# Patient Record
Sex: Male | Born: 1985 | Race: White | Hispanic: No | Marital: Married | Smoking: Never smoker
Health system: Southern US, Community
[De-identification: ages and names within clinical notes are randomized; demographics above are authoritative.]

## PROBLEM LIST (undated history)

## (undated) DIAGNOSIS — K219 Gastro-esophageal reflux disease without esophagitis: Secondary | ICD-10-CM

---

## 2015-07-15 ENCOUNTER — Encounter (HOSPITAL_COMMUNITY): Payer: Self-pay | Admitting: Emergency Medicine

## 2015-07-15 ENCOUNTER — Emergency Department (HOSPITAL_COMMUNITY): Payer: BLUE CROSS/BLUE SHIELD

## 2015-07-15 ENCOUNTER — Emergency Department (HOSPITAL_COMMUNITY)
Admission: EM | Admit: 2015-07-15 | Discharge: 2015-07-15 | Disposition: A | Discharge status: Home / Self Care | Payer: BLUE CROSS/BLUE SHIELD | Attending: Emergency Medicine | Admitting: Emergency Medicine

## 2015-07-15 DIAGNOSIS — K219 Gastro-esophageal reflux disease without esophagitis: Secondary | ICD-10-CM | POA: Diagnosis not present

## 2015-07-15 DIAGNOSIS — R079 Chest pain, unspecified: Secondary | ICD-10-CM | POA: Diagnosis not present

## 2015-07-15 DIAGNOSIS — R1012 Left upper quadrant pain: Secondary | ICD-10-CM | POA: Diagnosis present

## 2015-07-15 DIAGNOSIS — K5909 Other constipation: Secondary | ICD-10-CM

## 2015-07-15 DIAGNOSIS — Z79899 Other long term (current) drug therapy: Secondary | ICD-10-CM | POA: Insufficient documentation

## 2015-07-15 HISTORY — DX: Gastro-esophageal reflux disease without esophagitis: K21.9

## 2015-07-15 LAB — COMPREHENSIVE METABOLIC PANEL
ALT: 37 U/L (ref 17–63)
ANION GAP: 8 (ref 5–15)
AST: 31 U/L (ref 15–41)
Albumin: 4.5 g/dL (ref 3.5–5.0)
Alkaline Phosphatase: 52 U/L (ref 38–126)
BILIRUBIN TOTAL: 0.4 mg/dL (ref 0.3–1.2)
BUN: 15 mg/dL (ref 6–20)
CO2: 22 mmol/L (ref 22–32)
Calcium: 9.5 mg/dL (ref 8.9–10.3)
Chloride: 107 mmol/L (ref 101–111)
Creatinine, Ser: 1.01 mg/dL (ref 0.61–1.24)
GFR calc Af Amer: >60 mL/min (ref >60)
Glucose, Bld: 105 mg/dL — ABNORMAL HIGH (ref 65–99)
POTASSIUM: 3.8 mmol/L (ref 3.5–5.1)
Sodium: 137 mmol/L (ref 135–145)
TOTAL PROTEIN: 7.5 g/dL (ref 6.5–8.1)

## 2015-07-15 LAB — CBC WITH DIFFERENTIAL/PLATELET
Basophils Absolute: 0 10*3/uL (ref 0.0–0.1)
Basophils Relative: 0 %
Eosinophils Absolute: 0 10*3/uL (ref 0.0–0.7)
Eosinophils Relative: 0 %
HEMATOCRIT: 44.2 % (ref 39.0–52.0)
Hemoglobin: 15.4 g/dL (ref 13.0–17.0)
LYMPHS ABS: 1.6 10*3/uL (ref 0.7–4.0)
LYMPHS PCT: 22 %
MCH: 28.8 pg (ref 26.0–34.0)
MCHC: 34.8 g/dL (ref 30.0–36.0)
MCV: 82.8 fL (ref 78.0–100.0)
MONO ABS: 0.6 10*3/uL (ref 0.1–1.0)
MONOS PCT: 8 %
NEUTROS ABS: 4.9 10*3/uL (ref 1.7–7.7)
Neutrophils Relative %: 70 %
Platelets: 212 10*3/uL (ref 150–400)
RBC: 5.34 MIL/uL (ref 4.22–5.81)
RDW: 13.1 % (ref 11.5–15.5)
WBC: 7.2 10*3/uL (ref 4.0–10.5)

## 2015-07-15 LAB — URINALYSIS, ROUTINE W REFLEX MICROSCOPIC
BILIRUBIN URINE: NEGATIVE
Glucose, UA: NEGATIVE mg/dL
HGB URINE DIPSTICK: NEGATIVE
KETONES UR: NEGATIVE mg/dL
Leukocytes, UA: NEGATIVE
NITRITE: NEGATIVE
PH: 7.5 (ref 5.0–8.0)
Protein, ur: NEGATIVE mg/dL
Specific Gravity, Urine: 1.015 (ref 1.005–1.030)
Urobilinogen, UA: 0.2 mg/dL (ref 0.0–1.0)

## 2015-07-15 LAB — LIPASE, BLOOD: LIPASE: 41 U/L (ref 22–51)

## 2015-07-15 MED ORDER — LORAZEPAM 2 MG/ML IJ SOLN
1.0000 mg | Freq: Once | INTRAMUSCULAR | Status: AC
  Administered 2015-07-15: 1 mg via INTRAVENOUS
  Filled 2015-07-15: qty 1

## 2015-07-15 MED ORDER — SUCRALFATE 1 G PO TABS
1.0000 g | ORAL_TABLET | Freq: Once | ORAL | Status: AC
  Administered 2015-07-15: 1 g via ORAL
  Filled 2015-07-15: qty 1

## 2015-07-15 MED ORDER — SODIUM CHLORIDE 0.9 % IV SOLN
INTRAVENOUS | Status: DC
  Administered 2015-07-15: 08:00:00 via INTRAVENOUS

## 2015-07-15 MED ORDER — GI COCKTAIL ~~LOC~~
30.0000 mL | Freq: Once | ORAL | Status: AC
  Administered 2015-07-15: 30 mL via ORAL
  Filled 2015-07-15: qty 30

## 2015-07-15 MED ORDER — BISACODYL 5 MG PO TBEC: 5.0000 mg | DELAYED_RELEASE_TABLET | Freq: Two times a day (BID) | ORAL | Status: DC

## 2015-07-15 NOTE — ED Provider Notes (Signed)
CSN: [id number]     Arrival date & time 07/15/15  0654 History   First MD Initiated Contact with Patient 07/15/15 [phone]     Chief Complaint  Patient presents with  . Abdominal Pain  . Chest Pain     (Consider location/radiation/quality/duration/timing/severity/associated sxs/prior Treatment) HPI Comments: This is pts 3rd episode since April , not seen by pcp  Patient is a 29 y.o. male presenting with abdominal pain and chest pain. The history is provided by the patient.  Abdominal Pain Pain location:  LUQ, LLQ and epigastric Pain quality: aching and bloating   Pain radiates to:  L shoulder and R shoulder Pain severity:  No pain Onset quality:  Sudden Duration:  6 hours Timing:  Constant Progression:  Worsening Chronicity:  Recurrent Context: not alcohol use   Relieved by:  Nothing Worsened by:  Nothing tried Ineffective treatments:  OTC medications Associated symptoms: chest pain   Chest Pain Associated symptoms: abdominal pain     Past Medical History  Diagnosis Date  . Acid reflux    History reviewed. No pertinent past surgical history. History reviewed. No pertinent family history. Social History  Substance Use Topics  . Smoking status: Never Smoker   . Smokeless tobacco: None  . Alcohol Use: No    Review of Systems  Cardiovascular: Positive for chest pain.  Gastrointestinal: Positive for abdominal pain.  All other systems reviewed and are negative.     Allergies  Sulfa antibiotics  Home Medications   Prior to Admission medications   Medication Sig Start Date End Date Taking? Authorizing Provider  aspirin-acetaminophen-caffeine (EXCEDRIN MIGRAINE) [phone] MG per tablet Take 1 tablet by mouth every 6 (six) hours as needed for headache.   Yes Historical Provider, MD  bismuth subsalicylate (PEPTO BISMOL) 262 MG chewable tablet Chew 524 mg by mouth as needed for indigestion.   Yes Historical Provider, MD  calcium carbonate (TUMS - DOSED IN MG ELEMENTAL  CALCIUM) 500 MG chewable tablet Chew 1 tablet by mouth daily.   Yes Historical Provider, MD  diphenhydrAMINE (BENADRYL) 25 mg capsule Take 25 mg by mouth every 6 (six) hours as needed for allergies or sleep.   Yes Historical Provider, MD  DM-Doxylamine-Acetaminophen (NYQUIL COLD & FLU PO) Take 1 tablet by mouth every 8 (eight) hours as needed (cold/flu).   Yes Historical Provider, MD  lansoprazole (PREVACID) 15 MG capsule Take 15 mg by mouth daily at 12 noon.   Yes Historical Provider, MD  Simethicone (GAS RELIEF PO) Take 1 tablet by mouth every 6 (six) hours as needed (for gas).   Yes Historical Provider, MD   BP 146/86 mmHg  Pulse 66  Temp(Src) 98.6 F (37 C) (Oral)  Resp 20  SpO2 100% Physical Exam  Constitutional: He is oriented to person, place, and time. He appears well-developed and well-nourished.  Non-toxic appearance. No distress.  HENT:  Head: Normocephalic and atraumatic.  Eyes: Conjunctivae, EOM and lids are normal. Pupils are equal, round, and reactive to light.  Neck: Normal range of motion. Neck supple. No tracheal deviation present. No thyroid mass present.  Cardiovascular: Normal rate, regular rhythm and normal heart sounds.  Exam reveals no gallop.   No murmur heard. Pulmonary/Chest: Effort normal and breath sounds normal. No stridor. No respiratory distress. He has no decreased breath sounds. He has no wheezes. He has no rhonchi. He has no rales.  Abdominal: Soft. Normal appearance and bowel sounds are normal. He exhibits no distension. There is no tenderness. There is no  rigidity, no rebound, no guarding and no CVA tenderness.    Musculoskeletal: Normal range of motion. He exhibits no edema or tenderness.  Neurological: He is alert and oriented to person, place, and time. He has normal strength. No cranial nerve deficit or sensory deficit. GCS eye subscore is 4. GCS verbal subscore is 5. GCS motor subscore is 6.  Skin: Skin is warm and dry. No abrasion and no rash  noted.  Psychiatric: He has a normal mood and affect. His speech is normal and behavior is normal.  Nursing note and vitals reviewed.   ED Course  Procedures (including critical care time) Labs Review Labs Reviewed  CBC WITH DIFFERENTIAL/PLATELET  COMPREHENSIVE METABOLIC PANEL  LIPASE, BLOOD  URINALYSIS, ROUTINE W REFLEX MICROSCOPIC (NOT AT [facility])    Imaging Review No results found. I have personally reviewed and evaluated these images and lab results as part of my medical decision-making.   EKG Interpretation None      MDM   Final diagnoses:  None    Patient to be treated for constipation    Lorre Nick, MD 07/15/15 [phone]

## 2015-07-15 NOTE — Discharge Instructions (Signed)
Abdominal Pain °Many things can cause abdominal pain. Usually, abdominal pain is not caused by a disease and will improve without treatment. It can often be observed and treated at home. Your health care provider will do a physical exam and possibly order blood tests and X-rays to help determine the seriousness of your pain. However, in many cases, more time must pass before a clear cause of the pain can be found. Before that point, your health care provider may not know if you need more testing or further treatment. °HOME CARE INSTRUCTIONS  °Monitor your abdominal pain for any changes. The following actions may help to alleviate any discomfort you are experiencing: °· Only take over-the-counter or prescription medicines as directed by your health care provider. °· Do not take laxatives unless directed to do so by your health care provider. °· Try a clear liquid diet (broth, tea, or water) as directed by your health care provider. Slowly move to a bland diet as tolerated. °SEEK MEDICAL CARE IF: °· You have unexplained abdominal pain. °· You have abdominal pain associated with nausea or diarrhea. °· You have pain when you urinate or have a bowel movement. °· You experience abdominal pain that wakes you in the night. °· You have abdominal pain that is worsened or improved by eating food. °· You have abdominal pain that is worsened with eating fatty foods. °· You have a fever. °SEEK IMMEDIATE MEDICAL CARE IF:  °· Your pain does not go away within 2 hours. °· You keep throwing up (vomiting). °· Your pain is felt only in portions of the abdomen, such as the right side or the left lower portion of the abdomen. °· You pass bloody or black tarry stools. °MAKE SURE YOU: °· Understand these instructions.   °· Will watch your condition.   °· Will get help right away if you are not doing well or get worse.   °Document Released: 07/16/2005 Document Revised: 10/11/2013 Document Reviewed: 06/15/2013 °[employer]® Patient Information  ©2015 [employer]. This information is not intended to replace advice given to you by your health care provider. Make sure you discuss any questions you have with your health care provider. ° °Constipation °Constipation is when a person has fewer than three bowel movements a week, has difficulty having a bowel movement, or has stools that are dry, hard, or larger than normal. As people grow older, constipation is more common. If you try to fix constipation with medicines that make you have a bowel movement (laxatives), the problem may get worse. Long-term laxative use may cause the muscles of the colon to become weak. A low-fiber diet, not taking in enough fluids, and taking certain medicines may make constipation worse.  °CAUSES  °· Certain medicines, such as antidepressants, pain medicine, iron supplements, antacids, and water pills.   °· Certain diseases, such as diabetes, irritable bowel syndrome (IBS), thyroid disease, or depression.   °· Not drinking enough water.   °· Not eating enough fiber-rich foods.   °· Stress or travel.   °· Lack of physical activity or exercise.   °· Ignoring the urge to have a bowel movement.   °· Using laxatives too much.   °SIGNS AND SYMPTOMS  °· Having fewer than three bowel movements a week.   °· Straining to have a bowel movement.   °· Having stools that are hard, dry, or larger than normal.   °· Feeling full or bloated.   °· Pain in the lower abdomen.   °· Not feeling relief after having a bowel movement.   °DIAGNOSIS  °Your health care provider will take   a medical history and perform a physical exam. Further testing may be done for severe constipation. Some tests may include: °· A barium enema X-ray to examine your rectum, colon, and, sometimes, your small intestine.   °· A sigmoidoscopy to examine your lower colon.   °· A colonoscopy to examine your entire colon. °TREATMENT  °Treatment will depend on the severity of your constipation and what is causing it. Some dietary  treatments include drinking more fluids and eating more fiber-rich foods. Lifestyle treatments may include regular exercise. If these diet and lifestyle recommendations do not help, your health care provider may recommend taking over-the-counter laxative medicines to help you have bowel movements. Prescription medicines may be prescribed if over-the-counter medicines do not work.  °HOME CARE INSTRUCTIONS  °· Eat foods that have a lot of fiber, such as fruits, vegetables, whole grains, and beans. °· Limit foods high in fat and processed sugars, such as french fries, hamburgers, cookies, candies, and soda.   °· A fiber supplement may be added to your diet if you cannot get enough fiber from foods.   °· Drink enough fluids to keep your urine clear or pale yellow.   °· Exercise regularly or as directed by your health care provider.   °· Go to the restroom when you have the urge to go. Do not hold it.   °· Only take over-the-counter or prescription medicines as directed by your health care provider. Do not take other medicines for constipation without talking to your health care provider first.   °SEEK IMMEDIATE MEDICAL CARE IF:  °· You have bright red blood in your stool.   °· Your constipation lasts for more than 4 days or gets worse.   °· You have abdominal or rectal pain.   °· You have thin, pencil-like stools.   °· You have unexplained weight loss. °MAKE SURE YOU:  °· Understand these instructions. °· Will watch your condition. °· Will get help right away if you are not doing well or get worse. °Document Released: 07/04/2004 Document Revised: 10/11/2013 Document Reviewed: 07/18/2013 °[employer]® Patient Information ©2015 [employer]. This information is not intended to replace advice given to you by your health care provider. Make sure you discuss any questions you have with your health care provider. ° °

## 2015-07-15 NOTE — ED Notes (Signed)
MD at bedside. 

## 2015-07-15 NOTE — ED Notes (Signed)
Assessment completed on flow sheet.

## 2015-07-15 NOTE — ED Notes (Signed)
Pt reports chest/abd pain since 0130 today. Pt hx of acid reflux and has been having "gas pains" for several days prior to increased pain today. No n/v/d. Some sob and dizziness. Friend reports pt has been anxious as well.

## 2016-06-18 ENCOUNTER — Emergency Department (HOSPITAL_COMMUNITY): Payer: BLUE CROSS/BLUE SHIELD

## 2016-06-18 ENCOUNTER — Encounter (HOSPITAL_COMMUNITY): Payer: Self-pay | Admitting: *Deleted

## 2016-06-18 ENCOUNTER — Emergency Department (HOSPITAL_COMMUNITY)
Admission: EM | Admit: 2016-06-18 | Discharge: 2016-06-18 | Disposition: A | Discharge status: Home / Self Care | Payer: BLUE CROSS/BLUE SHIELD | Attending: Emergency Medicine | Admitting: Emergency Medicine

## 2016-06-18 DIAGNOSIS — R197 Diarrhea, unspecified: Secondary | ICD-10-CM | POA: Diagnosis not present

## 2016-06-18 DIAGNOSIS — T887XXA Unspecified adverse effect of drug or medicament, initial encounter: Secondary | ICD-10-CM | POA: Diagnosis not present

## 2016-06-18 DIAGNOSIS — Z79899 Other long term (current) drug therapy: Secondary | ICD-10-CM | POA: Insufficient documentation

## 2016-06-18 DIAGNOSIS — Y829 Unspecified medical devices associated with adverse incidents: Secondary | ICD-10-CM | POA: Diagnosis not present

## 2016-06-18 DIAGNOSIS — R109 Unspecified abdominal pain: Secondary | ICD-10-CM

## 2016-06-18 DIAGNOSIS — T492X5A Adverse effect of local astringents and local detergents, initial encounter: Secondary | ICD-10-CM | POA: Diagnosis not present

## 2016-06-18 DIAGNOSIS — R112 Nausea with vomiting, unspecified: Secondary | ICD-10-CM | POA: Diagnosis not present

## 2016-06-18 DIAGNOSIS — R1084 Generalized abdominal pain: Secondary | ICD-10-CM | POA: Insufficient documentation

## 2016-06-18 DIAGNOSIS — Z7982 Long term (current) use of aspirin: Secondary | ICD-10-CM | POA: Insufficient documentation

## 2016-06-18 DIAGNOSIS — T50905A Adverse effect of unspecified drugs, medicaments and biological substances, initial encounter: Secondary | ICD-10-CM

## 2016-06-18 LAB — I-STAT CHEM 8, ED
BUN: 17 mg/dL (ref 6–20)
Calcium, Ion: 1.1 mmol/L — ABNORMAL LOW (ref 1.15–1.40)
Chloride: 104 mmol/L (ref 101–111)
Creatinine, Ser: 1.1 mg/dL (ref 0.61–1.24)
Glucose, Bld: 117 mg/dL — ABNORMAL HIGH (ref 65–99)
HCT: 45 % (ref 39.0–52.0)
Hemoglobin: 15.3 g/dL (ref 13.0–17.0)
Potassium: 3.3 mmol/L — ABNORMAL LOW (ref 3.5–5.1)
Sodium: 143 mmol/L (ref 135–145)
TCO2: 23 mmol/L (ref 0–100)

## 2016-06-18 LAB — CBC WITH DIFFERENTIAL/PLATELET
BASOS PCT: 0 %
Basophils Absolute: 0 10*3/uL (ref 0.0–0.1)
EOS ABS: 0 10*3/uL (ref 0.0–0.7)
Eosinophils Relative: 0 %
HEMATOCRIT: 42.9 % (ref 39.0–52.0)
HEMOGLOBIN: 15.5 g/dL (ref 13.0–17.0)
Lymphocytes Relative: 8 %
Lymphs Abs: 0.8 10*3/uL (ref 0.7–4.0)
MCH: 29.1 pg (ref 26.0–34.0)
MCHC: 36.1 g/dL — AB (ref 30.0–36.0)
MCV: 80.5 fL (ref 78.0–100.0)
MONOS PCT: 5 %
Monocytes Absolute: 0.5 10*3/uL (ref 0.1–1.0)
NEUTROS ABS: 8.9 10*3/uL — AB (ref 1.7–7.7)
NEUTROS PCT: 87 %
Platelets: 212 10*3/uL (ref 150–400)
RBC: 5.33 MIL/uL (ref 4.22–5.81)
RDW: 13 % (ref 11.5–15.5)
WBC: 10.2 10*3/uL (ref 4.0–10.5)

## 2016-06-18 MED ORDER — KETOROLAC TROMETHAMINE 30 MG/ML IJ SOLN
30.0000 mg | Freq: Once | INTRAMUSCULAR | Status: AC
  Administered 2016-06-18: 30 mg via INTRAVENOUS
  Filled 2016-06-18: qty 1

## 2016-06-18 MED ORDER — DICYCLOMINE HCL 10 MG/ML IM SOLN
20.0000 mg | Freq: Once | INTRAMUSCULAR | Status: AC
  Administered 2016-06-18: 20 mg via INTRAMUSCULAR
  Filled 2016-06-18: qty 2

## 2016-06-18 MED ORDER — ONDANSETRON HCL 4 MG/2ML IJ SOLN
4.0000 mg | Freq: Once | INTRAMUSCULAR | Status: AC
  Administered 2016-06-18: 4 mg via INTRAVENOUS
  Filled 2016-06-18 (×2): qty 2

## 2016-06-18 MED ORDER — GI COCKTAIL ~~LOC~~
30.0000 mL | Freq: Once | ORAL | Status: AC
  Administered 2016-06-18: 30 mL via ORAL
  Filled 2016-06-18: qty 30

## 2016-06-18 MED ORDER — DICYCLOMINE HCL 20 MG PO TABS: 20.0000 mg | ORAL_TABLET | Freq: Two times a day (BID) | ORAL | 0 refills | Status: DC

## 2016-06-18 NOTE — ED Notes (Signed)
Pt verbalized understanding of d/c instructions and has no further questions. Pt is stable, A&Ox4, VSS.  

## 2016-06-18 NOTE — ED Triage Notes (Addendum)
Pt states that he has had intermittent diarrhea x 3 days; pt states that around 0015 he began to have sharp stabbing upper abd pain; pt states that the pain has gotten progressively worse; pt states that he has vomited x 4; pt states that he began taking fiber gummies on Sunday due to chronic constipation; pt states that he took Gas-X and Pepto Bismol tonight without relief

## 2016-06-18 NOTE — ED Provider Notes (Addendum)
[facility]-EMERGENCY DEPT Provider Note   CSN: [id number] Arrival date & time: 06/18/16  0403     History   Chief Complaint Chief Complaint  Patient presents with  . Abdominal Pain    HPI Andy Allende is a 30 y.o. male.  The history is provided by the patient.  Abdominal Pain   This is a new problem. The current episode started 1 to 2 hours ago. The problem occurs constantly. The problem has not changed since onset.The pain is associated with an unknown factor. The pain is located in the generalized abdominal region. The pain is severe. Associated symptoms include diarrhea, nausea and vomiting. Pertinent negatives include fever. Nothing aggravates the symptoms. Nothing relieves the symptoms. Past workup does not include GI consult. His past medical history does not include PUD.  Normally has constipation but started having one episode of diarrhea daily since Sunday when he started taking fiber gummies for his chronic constipation.  This evening had diffuse abdominal pain and then emesis.    Past Medical History:  Diagnosis Date  . Acid reflux     There are no active problems to display for this patient.   History reviewed. No pertinent surgical history.     Home Medications    Prior to Admission medications   Medication Sig Start Date End Date Taking? Authorizing Provider  aspirin-acetaminophen-caffeine (EXCEDRIN MIGRAINE) [phone] MG per tablet Take 1 tablet by mouth every 6 (six) hours as needed for headache.    Historical Provider, MD  bisacodyl (DULCOLAX) 5 MG EC tablet Take 1 tablet (5 mg total) by mouth 2 (two) times daily. 07/15/15   Lorre Nick, MD  bismuth subsalicylate (PEPTO BISMOL) 262 MG chewable tablet Chew 524 mg by mouth as needed for indigestion.    Historical Provider, MD  calcium carbonate (TUMS - DOSED IN MG ELEMENTAL CALCIUM) 500 MG chewable tablet Chew 1 tablet by mouth daily.    Historical Provider, MD  diphenhydrAMINE (BENADRYL) 25 mg capsule Take 25  mg by mouth every 6 (six) hours as needed for allergies or sleep.    Historical Provider, MD  DM-Doxylamine-Acetaminophen (NYQUIL COLD & FLU PO) Take 1 tablet by mouth every 8 (eight) hours as needed (cold/flu).    Historical Provider, MD  lansoprazole (PREVACID) 15 MG capsule Take 15 mg by mouth daily at 12 noon.    Historical Provider, MD  Simethicone (GAS RELIEF PO) Take 1 tablet by mouth every 6 (six) hours as needed (for gas).    Historical Provider, MD    Family History No family history on file.  Social History Social History  Substance Use Topics  . Smoking status: Never Smoker  . Smokeless tobacco: Never Used  . Alcohol use No     Allergies   Sulfa antibiotics   Review of Systems Review of Systems  Constitutional: Negative for activity change, appetite change and fever.  Gastrointestinal: Positive for abdominal pain, diarrhea, nausea and vomiting.  All other systems reviewed and are negative.    Physical Exam Updated Vital Signs BP 132/74   Pulse (!) 58   Temp 97.7 F (36.5 C) (Oral)   Resp 26   Ht 5\' 11"  (1.803 m)   Wt 265 lb (120.2 kg)   SpO2 100%   BMI 36.96 kg/m   Physical Exam  Constitutional: He is oriented to person, place, and time. He appears well-developed and well-nourished. No distress.  HENT:  Head: Normocephalic and atraumatic.  Mouth/Throat: No oropharyngeal exudate.  Eyes: EOM are normal. Pupils  are equal, round, and reactive to light.  Neck: Normal range of motion. Neck supple.  Cardiovascular: Normal rate, regular rhythm, normal heart sounds and intact distal pulses.   Pulmonary/Chest: Effort normal and breath sounds normal.  Abdominal: Soft. He exhibits no distension and no mass. Bowel sounds are increased. There is no tenderness. There is no rebound and no guarding.  Musculoskeletal: Normal range of motion.  Neurological: He is alert and oriented to person, place, and time.  Skin: Skin is warm and dry. Capillary refill takes less  than 2 seconds.  Psychiatric: Thought content normal.     ED Treatments / Results  Labs (all labs ordered are listed, but only abnormal results are displayed) Labs Reviewed  CBC WITH DIFFERENTIAL/PLATELET  I-STAT CHEM 8, ED    EKG  EKG Interpretation None       Radiology No results found.  Procedures Procedures (including critical care time)  Medications Ordered in ED Medications  gi cocktail (Maalox,Lidocaine,Donnatal) (not administered)  ondansetron (ZOFRAN) injection 4 mg (not administered)  dicyclomine (BENTYL) injection 20 mg (not administered)  ketorolac (TORADOL) 30 MG/ML injection 30 mg (not administered)     Initial Impression / Assessment and Plan / ED Course  I have reviewed the triage vital signs and the nursing notes.  Pertinent labs & imaging results that were available during my care of the patient were reviewed by me and considered in my medical decision making (see chart for details).  Clinical Course     Final Clinical Impressions(s) / ED Diagnoses   Final diagnoses:  None   Vitals:   06/18/16 0419 06/18/16 0516  BP: 132/74 135/74  Pulse: (!) 58 66  Resp: 26 18  Temp: 97.7 F (36.5 C)    Results for orders placed or performed during the hospital encounter of 06/18/16  CBC with Differential/Platelet  Result Value Ref Range   WBC 10.2 4.0 - 10.5 K/uL   RBC 5.33 4.22 - 5.81 MIL/uL   Hemoglobin 15.5 13.0 - 17.0 g/dL   HCT 16.1 09.6 - 04.5 %   MCV 80.5 78.0 - 100.0 fL   MCH 29.1 26.0 - 34.0 pg   MCHC 36.1 (H) 30.0 - 36.0 g/dL   RDW 40.9 81.1 - 91.4 %   Platelets 212 150 - 400 K/uL   Neutrophils Relative % 87 %   Neutro Abs 8.9 (H) 1.7 - 7.7 K/uL   Lymphocytes Relative 8 %   Lymphs Abs 0.8 0.7 - 4.0 K/uL   Monocytes Relative 5 %   Monocytes Absolute 0.5 0.1 - 1.0 K/uL   Eosinophils Relative 0 %   Eosinophils Absolute 0.0 0.0 - 0.7 K/uL   Basophils Relative 0 %   Basophils Absolute 0.0 0.0 - 0.1 K/uL  I-stat chem 8, ed  Result  Value Ref Range   Sodium 143 135 - 145 mmol/L   Potassium 3.3 (L) 3.5 - 5.1 mmol/L   Chloride 104 101 - 111 mmol/L   BUN 17 6 - 20 mg/dL   Creatinine, Ser 7.82 0.61 - 1.24 mg/dL   Glucose, Bld 956 (H) 65 - 99 mg/dL   Calcium, Ion 2.13 (L) 1.15 - 1.40 mmol/L   TCO2 23 0 - 100 mmol/L   Hemoglobin 15.3 13.0 - 17.0 g/dL   HCT 08.6 57.8 - 46.9 %   Dg Abd Acute W/chest  Result Date: 06/18/2016 CLINICAL DATA:  Diarrhea for 3 days. Onset of upper abdominal pain last night. EXAM: DG ABDOMEN ACUTE W/ 1V CHEST COMPARISON:  07/15/2015 FINDINGS: There is no evidence of dilated bowel loops or free intraperitoneal air. No radiopaque calculi or other significant radiographic abnormality is seen. Heart size and mediastinal contours are within normal limits. Both lungs are clear. IMPRESSION: Negative abdominal radiographs.  No acute cardiopulmonary disease. Electronically Signed   By: Ellery Plunkaniel R Mitchell M.D.   On: 06/18/2016 06:14   Pain markedly improved post medication.  States he feels a lot better.  Po challenged successfully in the ED. New Prescriptions New Prescriptions   No medications on file  Exam and vitals are benign and reassuring.  No indication for advanced imaging.  There is still stool throughout on the acute abdominal series and I suspect this is cramping secondary to the fact that the patient took mag citrated which he did not originally tell me and the fiber gummies in attempt to evacuate his bowels.   Will send home with medication for cramping.  Would begin miralax daily as this does not cause cramping or bloating.  Have instructed patient and his partner on this.  All questions answered to patient's satisfaction. Based on history and exam patient has been appropriately medically screened and emergency conditions excluded. Patient is stable for discharge at this time. Follow up with your PMD for recheck in 2 days and strict return precautions given   Farzad Tibbetts, MD 06/18/16 [id number]      Cy BlamerApril Jordie Schreur, MD 06/18/16 [id number]

## 2017-03-28 ENCOUNTER — Emergency Department (HOSPITAL_COMMUNITY)
Admission: EM | Admit: 2017-03-28 | Discharge: 2017-03-28 | Disposition: A | Discharge status: Home / Self Care | Payer: BLUE CROSS/BLUE SHIELD | Attending: Emergency Medicine | Admitting: Emergency Medicine

## 2017-03-28 ENCOUNTER — Emergency Department (HOSPITAL_COMMUNITY): Payer: BLUE CROSS/BLUE SHIELD

## 2017-03-28 ENCOUNTER — Encounter (HOSPITAL_COMMUNITY): Payer: Self-pay

## 2017-03-28 DIAGNOSIS — Z79899 Other long term (current) drug therapy: Secondary | ICD-10-CM | POA: Insufficient documentation

## 2017-03-28 DIAGNOSIS — K8020 Calculus of gallbladder without cholecystitis without obstruction: Secondary | ICD-10-CM | POA: Diagnosis not present

## 2017-03-28 DIAGNOSIS — R1013 Epigastric pain: Secondary | ICD-10-CM | POA: Diagnosis present

## 2017-03-28 DIAGNOSIS — Z7982 Long term (current) use of aspirin: Secondary | ICD-10-CM | POA: Diagnosis not present

## 2017-03-28 LAB — URINALYSIS, ROUTINE W REFLEX MICROSCOPIC
BILIRUBIN URINE: NEGATIVE
GLUCOSE, UA: NEGATIVE mg/dL
HGB URINE DIPSTICK: NEGATIVE
Ketones, ur: NEGATIVE mg/dL
Leukocytes, UA: NEGATIVE
Nitrite: NEGATIVE
PROTEIN: NEGATIVE mg/dL
Specific Gravity, Urine: 1.023 (ref 1.005–1.030)
pH: 8 (ref 5.0–8.0)

## 2017-03-28 LAB — CBC
HCT: 43.4 % (ref 39.0–52.0)
Hemoglobin: 15.2 g/dL (ref 13.0–17.0)
MCH: 29.3 pg (ref 26.0–34.0)
MCHC: 35 g/dL (ref 30.0–36.0)
MCV: 83.8 fL (ref 78.0–100.0)
PLATELETS: 202 10*3/uL (ref 150–400)
RBC: 5.18 MIL/uL (ref 4.22–5.81)
RDW: 13.4 % (ref 11.5–15.5)
WBC: 6.8 10*3/uL (ref 4.0–10.5)

## 2017-03-28 LAB — DIFFERENTIAL
Basophils Absolute: 0 10*3/uL (ref 0.0–0.1)
Basophils Relative: 0 %
EOS ABS: 0.1 10*3/uL (ref 0.0–0.7)
EOS PCT: 1 %
Lymphocytes Relative: 23 %
Lymphs Abs: 1.5 10*3/uL (ref 0.7–4.0)
MONO ABS: 0.4 10*3/uL (ref 0.1–1.0)
Monocytes Relative: 6 %
NEUTROS PCT: 70 %
Neutro Abs: 4.6 10*3/uL (ref 1.7–7.7)

## 2017-03-28 LAB — COMPREHENSIVE METABOLIC PANEL
ALBUMIN: 4.5 g/dL (ref 3.5–5.0)
ALK PHOS: 54 U/L (ref 38–126)
ALT: 31 U/L (ref 17–63)
AST: 28 U/L (ref 15–41)
Anion gap: 9 (ref 5–15)
BUN: 12 mg/dL (ref 6–20)
CALCIUM: 9.5 mg/dL (ref 8.9–10.3)
CHLORIDE: 106 mmol/L (ref 101–111)
CO2: 24 mmol/L (ref 22–32)
CREATININE: 1.07 mg/dL (ref 0.61–1.24)
GFR calc Af Amer: >60 mL/min (ref >60)
GFR calc non Af Amer: >60 mL/min (ref >60)
Glucose, Bld: 132 mg/dL — ABNORMAL HIGH (ref 65–99)
Potassium: 3.8 mmol/L (ref 3.5–5.1)
SODIUM: 139 mmol/L (ref 135–145)
Total Bilirubin: 0.4 mg/dL (ref 0.3–1.2)
Total Protein: 7.8 g/dL (ref 6.5–8.1)

## 2017-03-28 LAB — LIPASE, BLOOD: LIPASE: 46 U/L (ref 11–51)

## 2017-03-28 MED ORDER — MORPHINE SULFATE (PF) 2 MG/ML IV SOLN
4.0000 mg | Freq: Once | INTRAVENOUS | Status: AC
  Administered 2017-03-28: 4 mg via INTRAVENOUS
  Filled 2017-03-28: qty 2

## 2017-03-28 MED ORDER — FENTANYL CITRATE (PF) 100 MCG/2ML IJ SOLN
50.0000 ug | INTRAMUSCULAR | Status: DC | PRN
  Administered 2017-03-28: 50 ug via INTRAVENOUS
  Filled 2017-03-28: qty 2

## 2017-03-28 MED ORDER — DICYCLOMINE HCL 20 MG PO TABS: 20.0000 mg | ORAL_TABLET | Freq: Two times a day (BID) | ORAL | 0 refills | Status: AC | PRN

## 2017-03-28 MED ORDER — SODIUM CHLORIDE 0.9 % IV BOLUS (SEPSIS)
1000.0000 mL | Freq: Once | INTRAVENOUS | Status: AC
  Administered 2017-03-28: 1000 mL via INTRAVENOUS

## 2017-03-28 MED ORDER — PROMETHAZINE HCL 25 MG PO TABS: 25.0000 mg | ORAL_TABLET | Freq: Four times a day (QID) | ORAL | 0 refills | Status: AC | PRN

## 2017-03-28 MED ORDER — PROMETHAZINE HCL 25 MG/ML IJ SOLN
25.0000 mg | INTRAMUSCULAR | Status: AC
  Administered 2017-03-28: 25 mg via INTRAVENOUS
  Filled 2017-03-28: qty 1

## 2017-03-28 MED ORDER — GI COCKTAIL ~~LOC~~
30.0000 mL | Freq: Once | ORAL | Status: AC
  Administered 2017-03-28: 30 mL via ORAL
  Filled 2017-03-28: qty 30

## 2017-03-28 MED ORDER — DICYCLOMINE HCL 10 MG PO CAPS
10.0000 mg | ORAL_CAPSULE | Freq: Once | ORAL | Status: AC
  Administered 2017-03-28: 10 mg via ORAL
  Filled 2017-03-28: qty 1

## 2017-03-28 MED ORDER — ONDANSETRON HCL 4 MG/2ML IJ SOLN
4.0000 mg | Freq: Once | INTRAMUSCULAR | Status: AC
  Administered 2017-03-28: 4 mg via INTRAVENOUS
  Filled 2017-03-28: qty 2

## 2017-03-28 MED ORDER — TRAMADOL HCL 50 MG PO TABS: 50.0000 mg | ORAL_TABLET | Freq: Four times a day (QID) | ORAL | 0 refills | Status: AC | PRN

## 2017-03-28 MED ORDER — PANTOPRAZOLE SODIUM 40 MG IV SOLR
40.0000 mg | Freq: Once | INTRAVENOUS | Status: AC
  Administered 2017-03-28: 40 mg via INTRAVENOUS
  Filled 2017-03-28: qty 40

## 2017-03-28 NOTE — Discharge Instructions (Signed)
It was my pleasure taking care of you today!   Phenergan as needed for nausea. Bentyl as needed for abdominal cramping / pain. Tramadol as needed for pain.  It is VERY important that you monitor your symptoms and return to the Emergency Department if you develop any of the following symptoms:  The pain does not go away.  You have a fever.  You keep throwing up and can't keep fluids down. You pass bloody or black tarry stools.  There is bright red blood in the stool. You do not seem to be getting better.  You have any questions or concerns.

## 2017-03-28 NOTE — ED Provider Notes (Signed)
[facility]-EMERGENCY DEPT Provider Note   CSN: [id number] Arrival date & time: 03/28/17  0522     History   Chief Complaint Chief [id number]  Patient presents with  . Abdominal Pain    HPI David Gibbs is a 31 y.o. male.  The history is provided by the patient and medical records. No language interpreter was used.   David Gibbs is a 31 y.o. male  with a PMH of acid reflux who presents to the Emergency Department complaining of 9/10 crampy, aching epigastric pain which began acutely at 2am this morning. Associated symptoms include nausea and two episodes of emesis (once at home and once in ER). Patient endorses history of similar in which he has had to come to ER. He took pepto and tums prior to arrival with little relief. He states that he struggles with constipation, therefore shortly after taking these medications, took mag citrate thinking it could be related to constipation, however immediately threw this medication up. He does note that his bowels have been moving quite regular for him. He had BM yesterday that was of usual consistency. No prior abdominal surgeries. No fever or chills. No diarrhea. No known sick contacts. Minimal ETOH use. Denies NSAID use.   Past Medical History:  Diagnosis Date  . Acid reflux     There are no active problems to display for this patient.   History reviewed. No pertinent surgical history.     Home Medications    Prior to Admission medications   Medication Sig Start Date End Date Taking? Authorizing Provider  aspirin-acetaminophen-caffeine (EXCEDRIN MIGRAINE) [phone] MG per tablet Take 2 tablets by mouth every 6 (six) hours as needed for headache.    Yes [provider]  Bismuth Subsalicylate (PEPTO-BISMOL) 262 MG TABS Take 524 mg by mouth every 6 (six) hours as needed (for GI upset).   Yes [provider]  calcium carbonate (TUMS - DOSED IN MG ELEMENTAL CALCIUM) 500 MG chewable tablet Chew 2 tablets by mouth 4 (four) times  daily as needed for indigestion or heartburn.    Yes [provider]  diphenhydrAMINE (BENADRYL) 25 mg capsule Take 25 mg by mouth at bedtime as needed for allergies or sleep.    Yes [provider]  magnesium citrate SOLN Take 0.5 Bottles by mouth once.   Yes [provider]  Pseudoeph-Doxylamine-DM-APAP (NYQUIL MULTI-SYMPTOM PO) Take 15 mLs by mouth at bedtime as needed (for cough/congestion).   Yes [provider]  simethicone (MYLICON) 125 MG chewable tablet Chew 250 mg by mouth every 6 (six) hours as needed for flatulence.   Yes [provider]  dicyclomine (BENTYL) 20 MG tablet Take 1 tablet (20 mg total) by mouth 2 (two) times daily as needed for spasms. 03/28/17   Kellis Topete, Chase PicketJaime Pilcher, PA-C  promethazine (PHENERGAN) 25 MG tablet Take 1 tablet (25 mg total) by mouth every 6 (six) hours as needed for nausea or vomiting. 03/28/17   Mayia Megill, Chase PicketJaime Pilcher, PA-C  traMADol (ULTRAM) 50 MG tablet Take 1 tablet (50 mg total) by mouth every 6 (six) hours as needed. 03/28/17   Seena Ritacco, Chase PicketJaime Pilcher, PA-C    Family History History reviewed. No pertinent family history.  Social History Social History  Substance Use Topics  . Smoking status: Never Smoker  . Smokeless tobacco: Never Used  . Alcohol use No     Allergies   Sulfa antibiotics   Review of Systems Review of Systems  Gastrointestinal: Positive for abdominal pain, nausea and vomiting. Negative  for blood in stool, constipation and diarrhea.  All other systems reviewed and are negative.    Physical Exam Updated Vital Signs BP 129/83   Pulse 60   Temp 98.6 F (37 C) (Oral)   Resp (!) 21   SpO2 100%   Physical Exam  Constitutional: He is oriented to person, place, and time. He appears well-developed and well-nourished. No distress.  HENT:  Head: Normocephalic and atraumatic.  Cardiovascular: Normal rate, regular rhythm and normal heart sounds.   No murmur heard. Pulmonary/Chest: Effort  normal and breath sounds normal. No respiratory distress. He has no wheezes. He has no rales.  Abdominal: Soft. Bowel sounds are normal. He exhibits no distension.  Tenderness to palpation of epigastrium. No rebound or guarding. Negative Murphy's.  Musculoskeletal: Normal range of motion.  Neurological: He is alert and oriented to person, place, and time.  Skin: Skin is warm and dry.  Nursing note and vitals reviewed.    ED Treatments / Results  Labs (all labs ordered are listed, but only abnormal results are displayed) Labs Reviewed  COMPREHENSIVE METABOLIC PANEL - Abnormal; Notable for the following:       Result Value   Glucose, Bld 132 (*)    All other components within normal limits  LIPASE, BLOOD  CBC  URINALYSIS, ROUTINE W REFLEX MICROSCOPIC  DIFFERENTIAL    EKG  EKG Interpretation None       Radiology US Abdomen Limited Ruq  Result Date: 03/28/2017 CLINICAL DATA:  Upper abdominal pain EXAM: ULTRASOUND ABDOMEN LIMITED RIGHT UPPER QUADRANT COMPARISON:  None. FINDINGS: Gallbladder: Within the gallbladder, there are echogenic foci which move and shadow consistent with cholelithiasis. Largest gallstone measures 1.4 cm in length. There is no appreciable gallbladder wall thickening or pericholecystic fluid. No sonographic Murphy sign noted by sonographer. Common bile duct: Diameter: 4 mm. No intrahepatic or extrahepatic biliary duct dilatation. Liver: No focal lesion identified. Liver echogenicity is increased diffusely. IMPRESSION: Cholelithiasis. Increase in liver echogenicity, a finding likely due to hepatic steatosis. While no focal liver lesions are evident, it must be cautioned that the sensitivity of ultrasound for detection of focal liver lesions is diminished in this circumstance. Electronically Signed   By: Bretta Bang III M.D.   On: 03/28/2017 08:22    Procedures Procedures (including critical care time)  Medications Ordered in ED Medications  fentaNYL  (SUBLIMAZE) injection 50 mcg (50 mcg Intravenous Given 03/28/17 0615)  ondansetron (ZOFRAN) injection 4 mg (4 mg Intravenous Given 03/28/17 0615)  sodium chloride 0.9 % bolus 1,000 mL (0 mLs Intravenous Stopped 03/28/17 0907)  gi cocktail (Maalox,Lidocaine,Donnatal) (30 mLs Oral Given 03/28/17 0650)  pantoprazole (PROTONIX) injection 40 mg (40 mg Intravenous Given 03/28/17 0651)  morphine 2 MG/ML injection 4 mg (4 mg Intravenous Given 03/28/17 0753)  promethazine (PHENERGAN) injection 25 mg (25 mg Intravenous Given 03/28/17 0802)  dicyclomine (BENTYL) capsule 10 mg (10 mg Oral Given 03/28/17 0908)     Initial Impression / Assessment and Plan / ED Course  I have reviewed the triage vital signs and the nursing notes.  Pertinent labs & imaging results that were available during my care of the patient were reviewed by me and considered in my medical decision making (see chart for details).    David Gibbs is a 31 y.o. male who presents to ED for epigastric abdominal pain, nausea and vomiting which began at 2 am this morning. On exam, patient is afebrile, hemodynamically stable with a non-surgical abdomen. He does have tenderness to palpation  of the epigastrium but no rebound or guarding. Negative Murphy's. Labs reviewed: glucose of 132 and all other values wdl. Patient given pain, nausea meds as well as protonix and GI cocktail.   RUQ ultrasound shows Cholelithiasis with no gallbladder wall thickening or. Cholecystic fluid. No signs of cholecystitis. Patient reevaluated and feels better after symptomatic management. Repeat abdominal exam reassuring with no peritoneal signs. Patient is tolerating PO. GenSurg follow up referral given. Discussed at length follow-up care, return precautions and symptomatic home care instructions. All questions answered.  Final Clinical Impressions(s) / ED Diagnoses   Final diagnoses:  Epigastric pain  Calculus of gallbladder without cholecystitis without obstruction    New  Prescriptions New Prescriptions   DICYCLOMINE (BENTYL) 20 MG TABLET    Take 1 tablet (20 mg total) by mouth 2 (two) times daily as needed for spasms.   PROMETHAZINE (PHENERGAN) 25 MG TABLET    Take 1 tablet (25 mg total) by mouth every 6 (six) hours as needed for nausea or vomiting.   TRAMADOL (ULTRAM) 50 MG TABLET    Take 1 tablet (50 mg total) by mouth every 6 (six) hours as needed.     Devany Aja, Chase Picket, PA-C 03/28/17 1610    Azalia Bilis, MD 03/28/17 [phone]

## 2017-03-28 NOTE — ED Notes (Signed)
Ultrasound at bedside

## 2017-03-28 NOTE — ED Triage Notes (Signed)
Pt reports 9/10 abd pain and n/v. Pt actively vomiting in triage.

## 2017-08-03 IMAGING — CR DG ABDOMEN ACUTE W/ 1V CHEST
4 series · 4 of 4 positions shown · non-contrast
Comparison: 07/15/2015

CLINICAL DATA: Diarrhea for 3 days. Onset of upper abdominal pain
last night.

EXAM:
DG ABDOMEN ACUTE W/ 1V CHEST

[w chest pa]
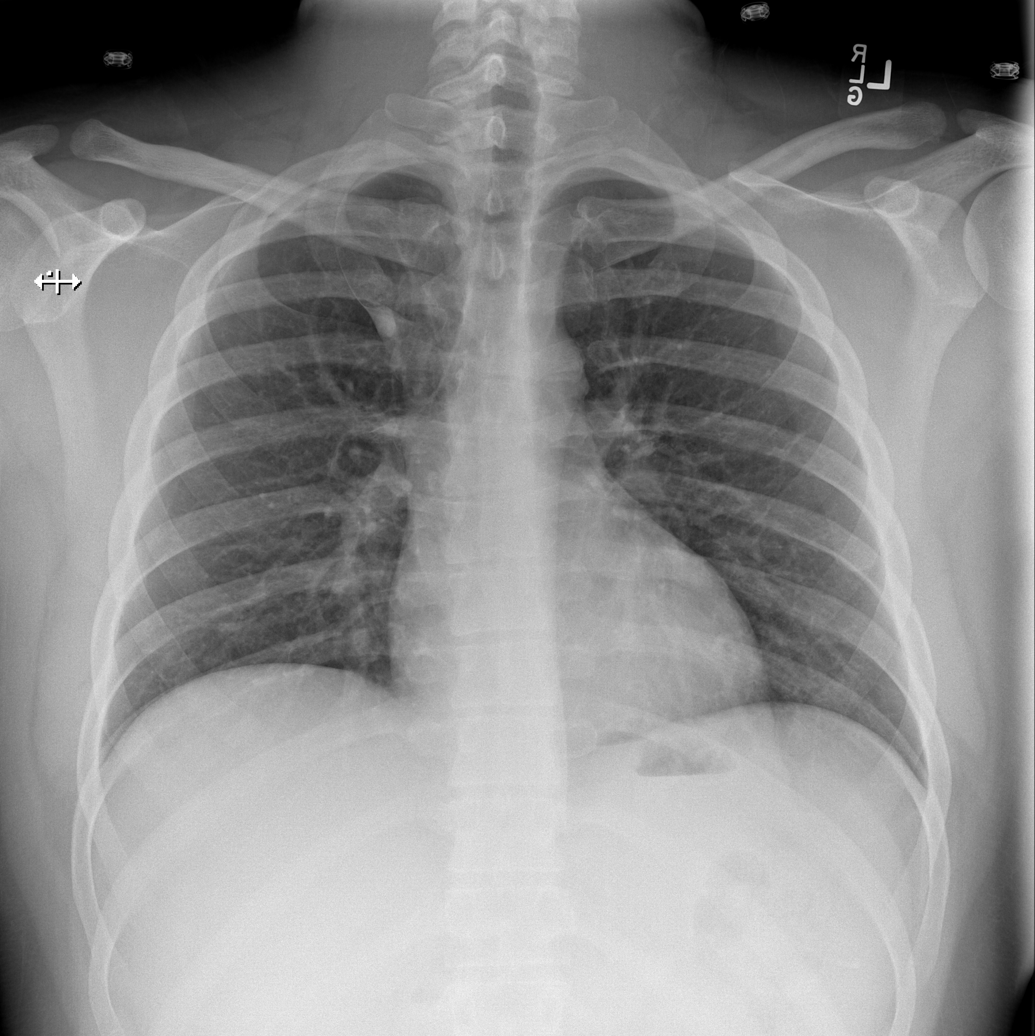

[w abdomen upright]
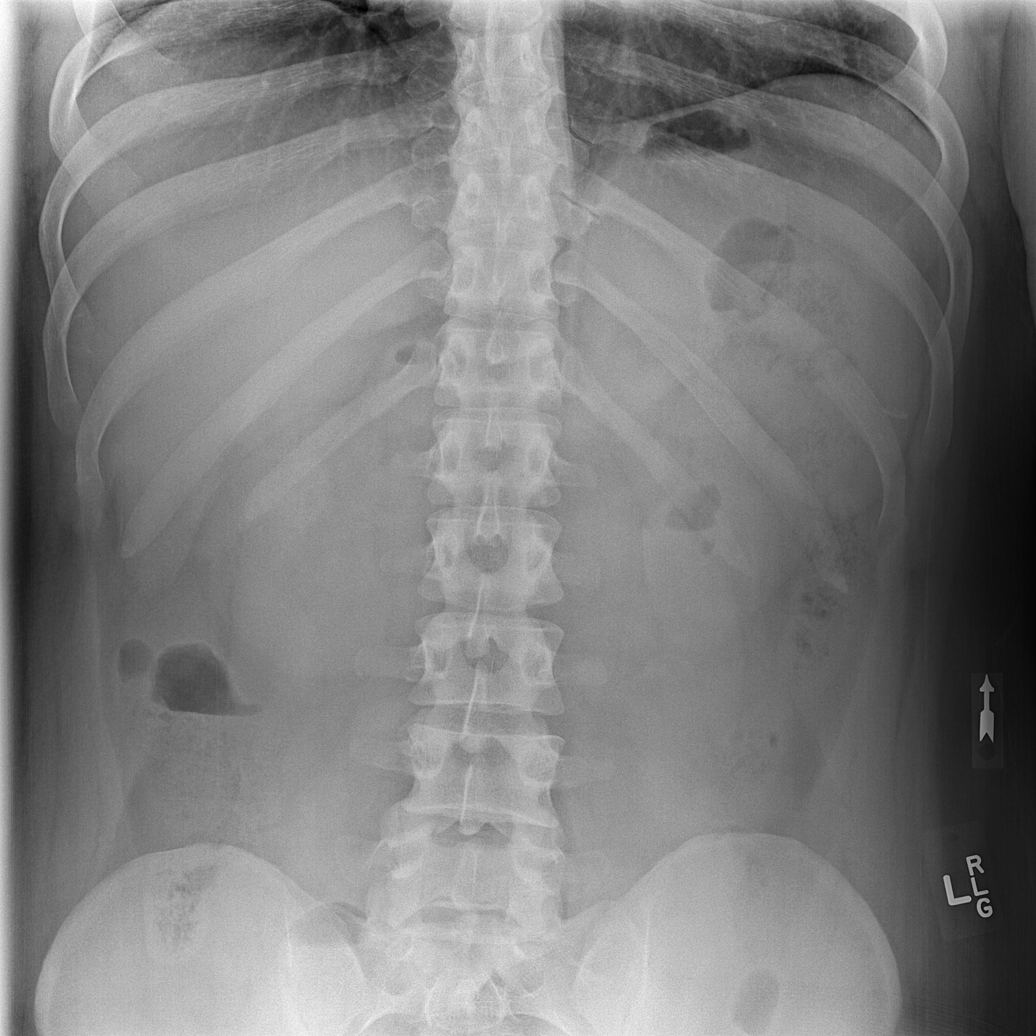

[t abdomen supine (1 of 2)]
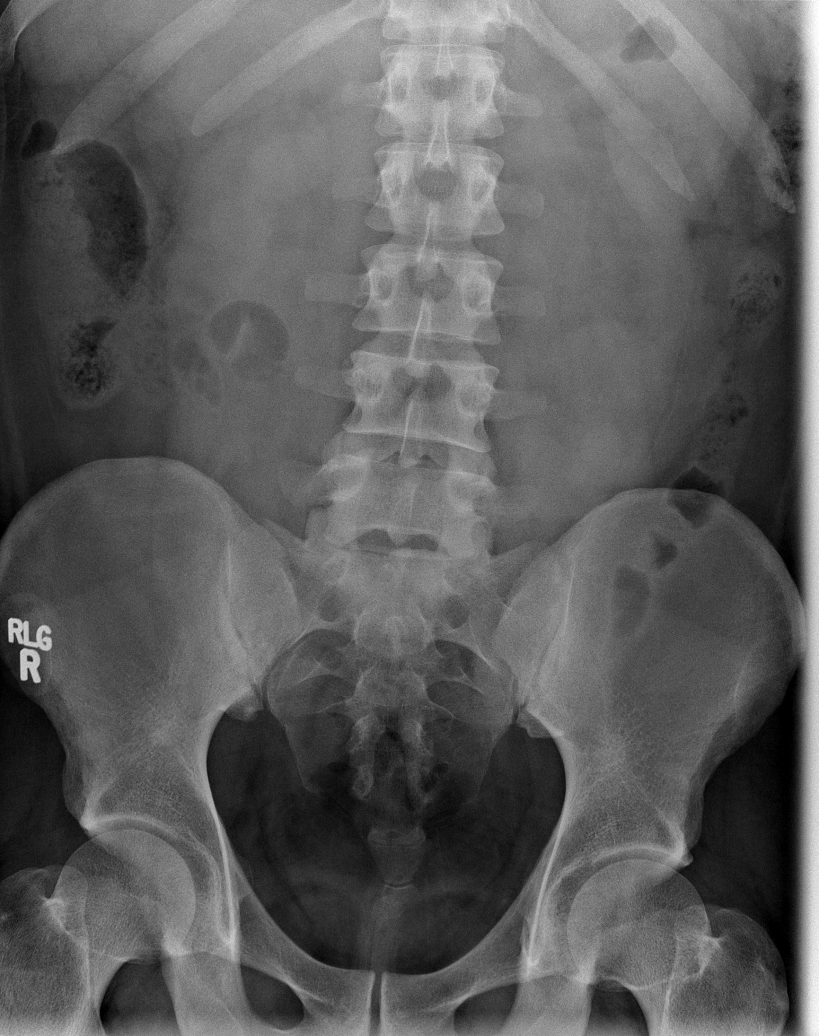

[t abdomen supine (2 of 2)]
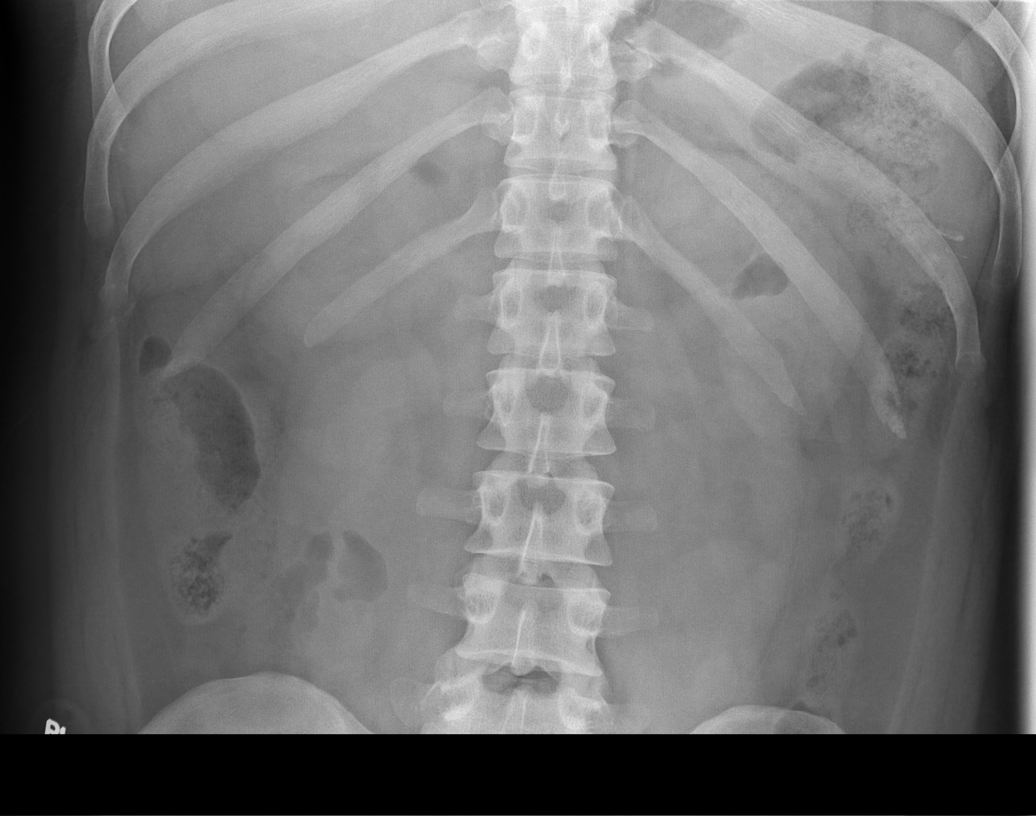

[4 of 4 positions shown; findings below may reference images not displayed]

FINDINGS: There is no evidence of dilated bowel loops or free intraperitoneal
air. No radiopaque calculi or other significant radiographic
abnormality is seen. Heart size and mediastinal contours are within
normal limits. Both lungs are clear.
IMPRESSION: Negative abdominal radiographs.  No acute cardiopulmonary disease.

## 2019-02-03 IMAGING — US US ABDOMEN LIMITED
1 series · 14 of 25 positions shown · non-contrast
Comparison: None.

CLINICAL DATA: Upper abdominal pain

EXAM:
ULTRASOUND ABDOMEN LIMITED RIGHT UPPER QUADRANT

[Series 1: us abdomen limited · 0.25mm/px · 14 of 43 slices shown]
[im 1/43]
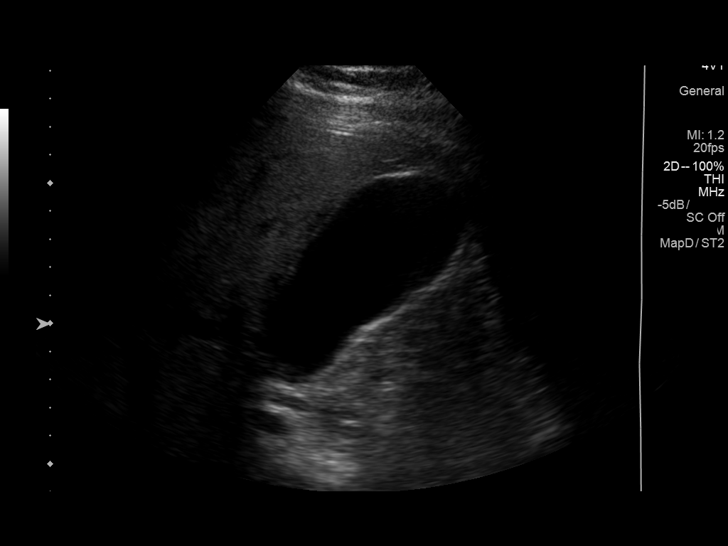
[im 4/43]
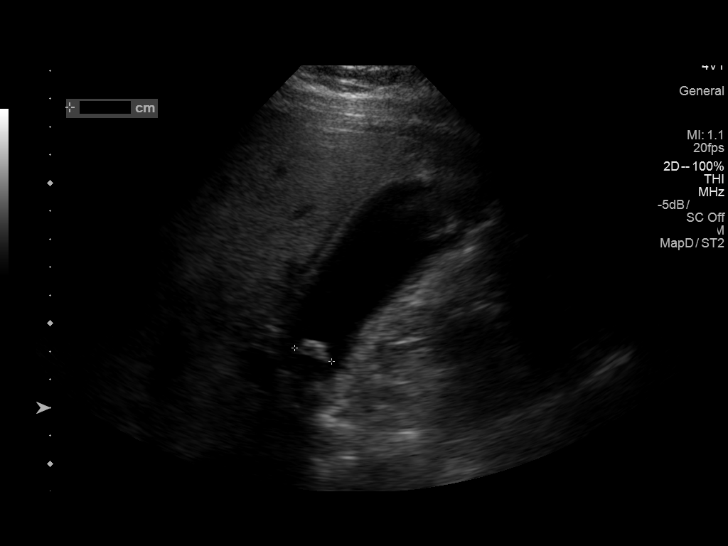
[im 8/43]
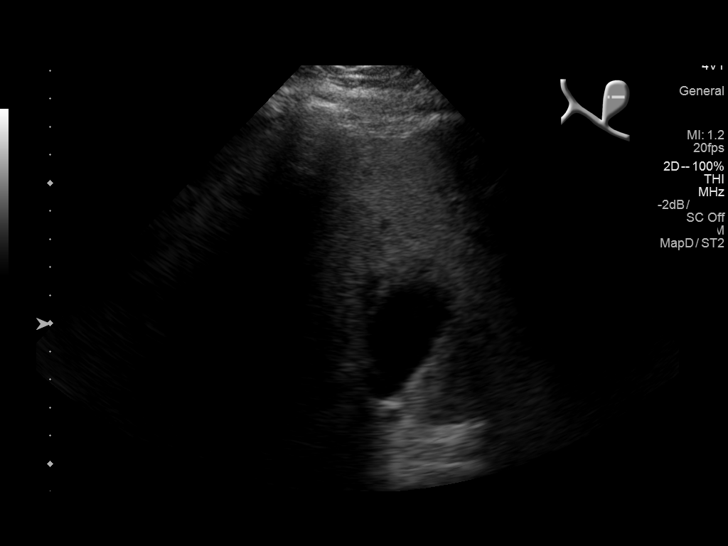
[im 11/43]
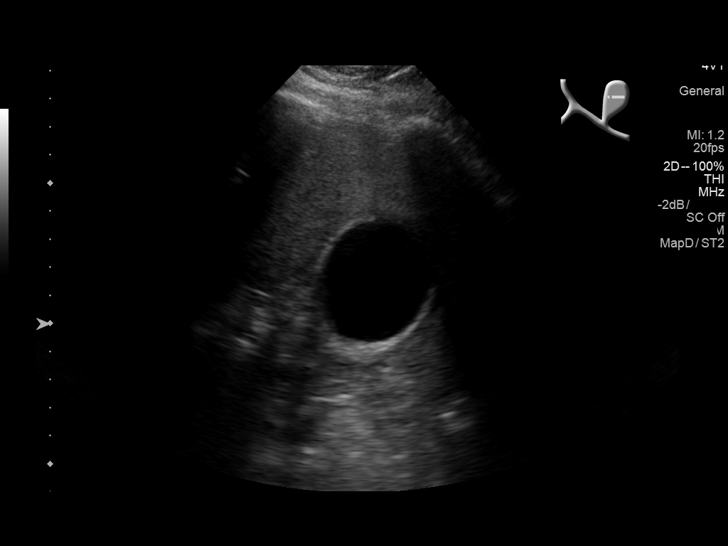
[im 15/43]
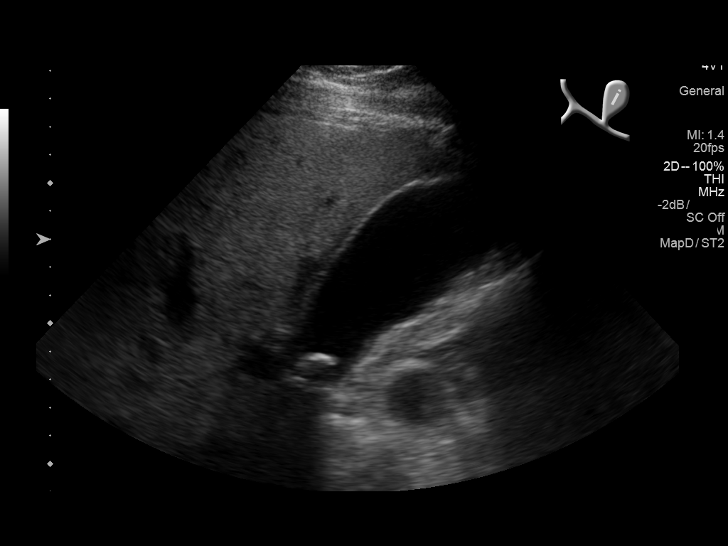
[im 16/43]
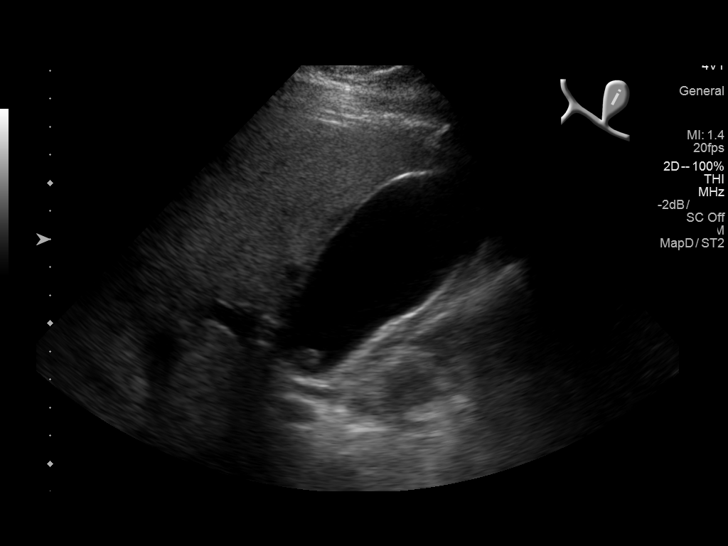
[im 20/43]
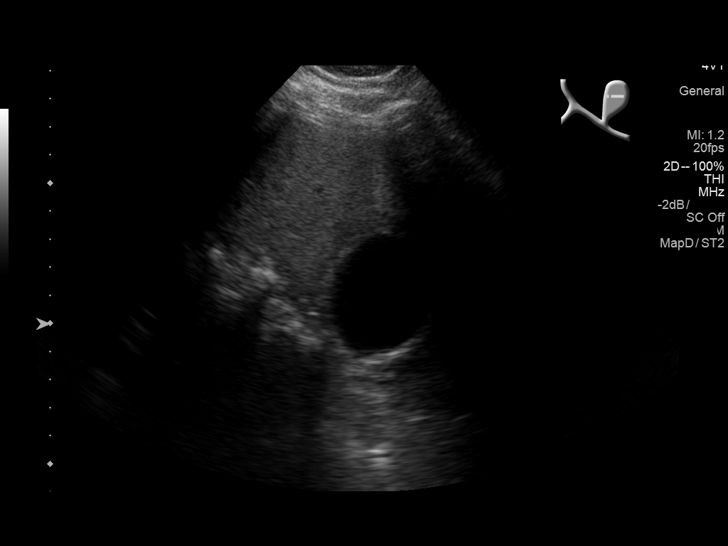
[im 23/43]
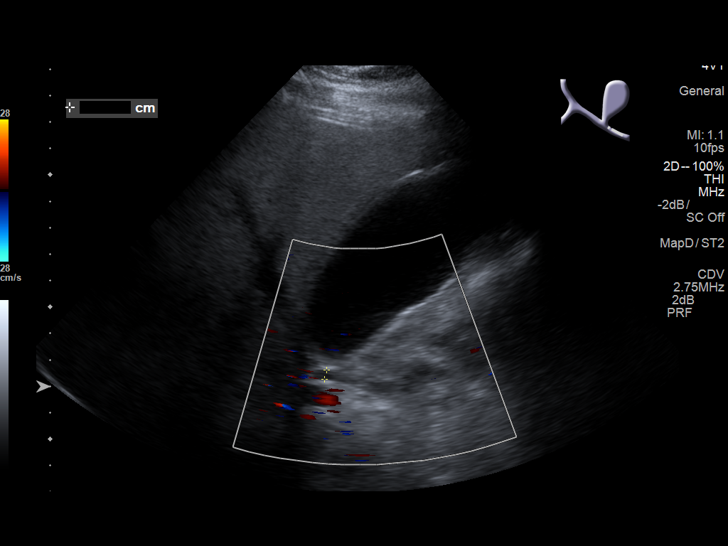
[im 27/43]
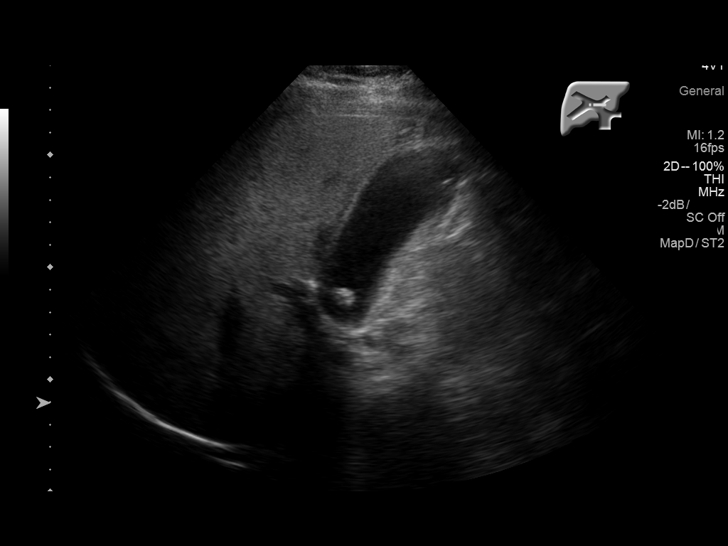
[im 29/43]
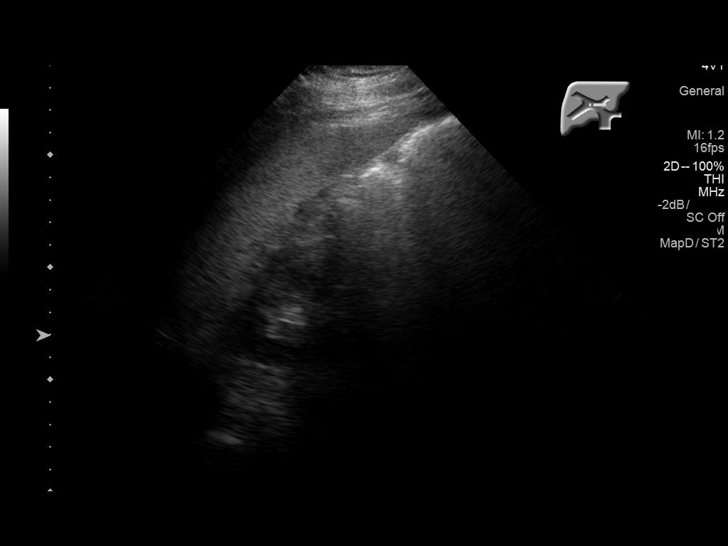
[im 32/43]
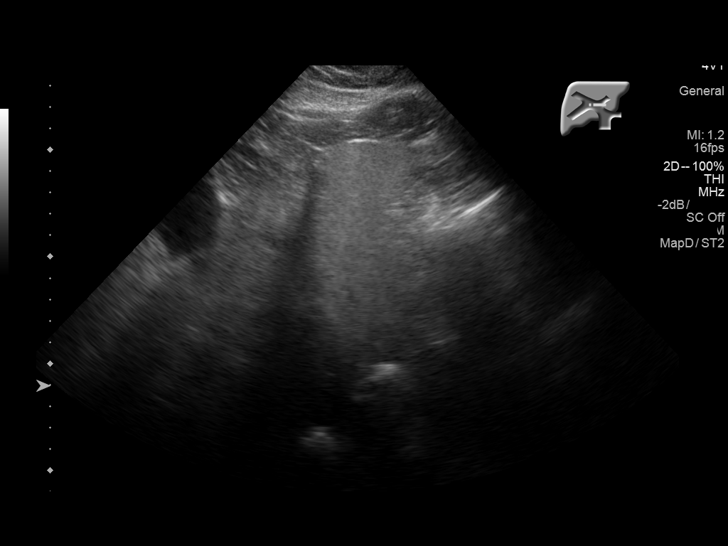
[im 36/43]
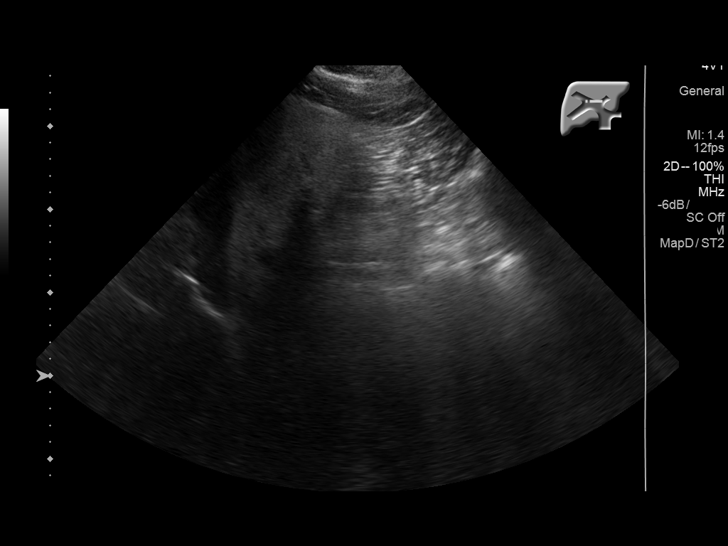
[im 39/43]
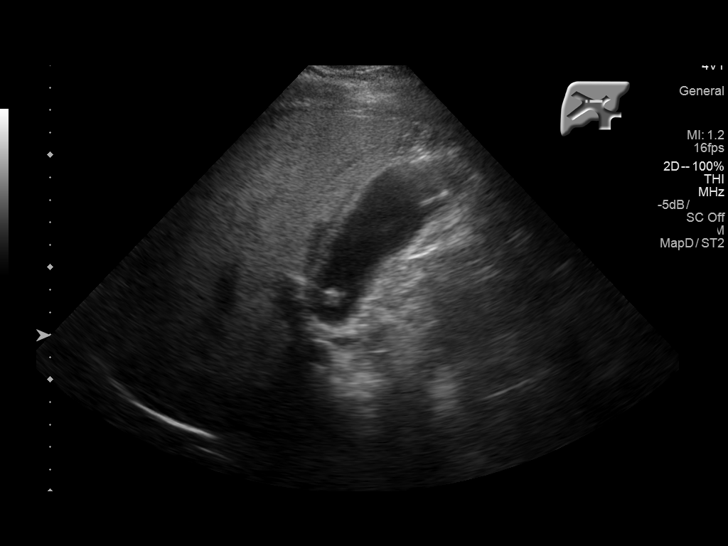
[im 43/43]
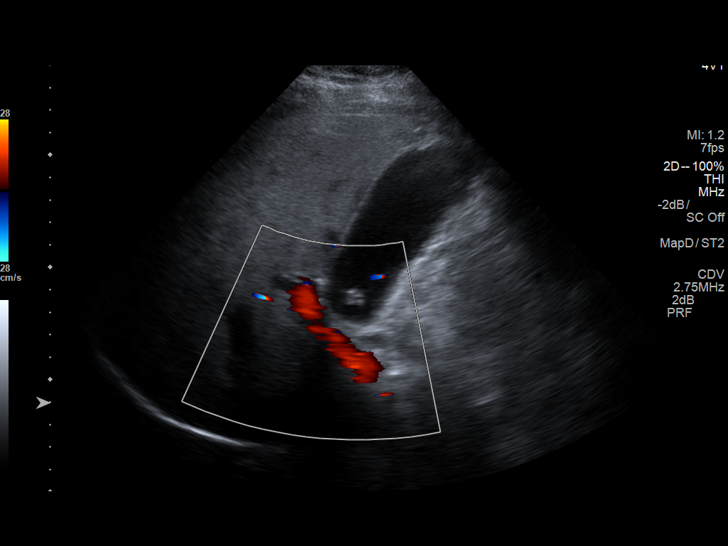

[14 of 25 positions shown; findings below may reference images not displayed]

FINDINGS: Gallbladder:

Within the gallbladder, there are echogenic foci which move and
shadow consistent with cholelithiasis. Largest gallstone measures
1.4 cm in length. There is no appreciable gallbladder wall
thickening or pericholecystic fluid. No sonographic Murphy sign
noted by sonographer.

Common bile duct:

Diameter: 4 mm. No intrahepatic or extrahepatic biliary duct
dilatation.

Liver:

No focal lesion identified. Liver echogenicity is increased
diffusely.
IMPRESSION: Cholelithiasis.

Increase in liver echogenicity, a finding likely due to hepatic
steatosis. While no focal liver lesions are evident, it must be
cautioned that the sensitivity of ultrasound for detection of focal
liver lesions is diminished in this circumstance.

## 2023-12-30 ENCOUNTER — Other Ambulatory Visit: Payer: Self-pay

## 2023-12-30 ENCOUNTER — Emergency Department (HOSPITAL_BASED_OUTPATIENT_CLINIC_OR_DEPARTMENT_OTHER)
Admission: EM | Admit: 2023-12-30 | Discharge: 2023-12-30 | Disposition: A | Discharge status: Home / Self Care | Attending: Emergency Medicine | Admitting: Emergency Medicine

## 2023-12-30 DIAGNOSIS — Z7982 Long term (current) use of aspirin: Secondary | ICD-10-CM | POA: Insufficient documentation

## 2023-12-30 DIAGNOSIS — G8929 Other chronic pain: Secondary | ICD-10-CM | POA: Diagnosis not present

## 2023-12-30 DIAGNOSIS — M5442 Lumbago with sciatica, left side: Secondary | ICD-10-CM | POA: Insufficient documentation

## 2023-12-30 DIAGNOSIS — M545 Low back pain, unspecified: Secondary | ICD-10-CM | POA: Diagnosis present

## 2023-12-30 DIAGNOSIS — Z79899 Other long term (current) drug therapy: Secondary | ICD-10-CM | POA: Diagnosis not present

## 2023-12-30 MED ORDER — PREDNISONE 10 MG (21) PO TBPK: ORAL_TABLET | Freq: Every day | ORAL | 0 refills | Status: AC

## 2023-12-30 MED ORDER — OXYCODONE-ACETAMINOPHEN 5-325 MG PO TABS
1.0000 | ORAL_TABLET | Freq: Once | ORAL | Status: AC
  Administered 2023-12-30: 1 via ORAL
  Filled 2023-12-30: qty 1

## 2023-12-30 MED ORDER — FENTANYL CITRATE PF 50 MCG/ML IJ SOSY
50.0000 ug | PREFILLED_SYRINGE | Freq: Once | INTRAMUSCULAR | Status: AC
  Administered 2023-12-30: 50 ug via INTRAMUSCULAR
  Filled 2023-12-30: qty 1

## 2023-12-30 MED ORDER — ONDANSETRON 4 MG PO TBDP
4.0000 mg | ORAL_TABLET | Freq: Once | ORAL | Status: AC
  Administered 2023-12-30: 4 mg via ORAL
  Filled 2023-12-30: qty 1

## 2023-12-30 MED ORDER — OXYCODONE-ACETAMINOPHEN 5-325 MG PO TABS: 1.0000 | ORAL_TABLET | Freq: Four times a day (QID) | ORAL | 0 refills | Status: AC | PRN

## 2023-12-30 MED ORDER — LIDOCAINE 5 % EX PTCH
1.0000 | MEDICATED_PATCH | CUTANEOUS | Status: DC
  Administered 2023-12-30: 1 via TRANSDERMAL
  Filled 2023-12-30: qty 1

## 2023-12-30 NOTE — ED Notes (Signed)
 Pt bib [facility], hx of back pain, endorses n/v. Denies injury, - thinners, 8/10 pain 141/96  98% HR 78 CBG 105

## 2023-12-30 NOTE — ED Notes (Signed)
Patient ambulated in hallway without assistance 

## 2023-12-30 NOTE — ED Provider Notes (Signed)
 [name] Provider Note   CSN: [id number] Arrival date & time: 12/30/23  0849     History  Chief Complaint  Patient presents with   Back Pain    David Gibbs is a 38 y.o. male.  With a history of back pain presenting to the ED for evaluation of back pain.  He states he has had this pain for quite some time.  Was told he has a herniated disc on MRI performed approximately 1 year ago.  He has been receiving lumbar steroid injections and oral steroids every few months for the past year.  He states his back felt sore when he went to bed last night.  When he woke up this morning, back pain was significantly worse.  He had a difficult time walking to the bathroom.  He had to sit down on the toilet.  When he had the significant pain he felt tingly all over.  He states he did have the urge to urinate but that went away after the pain worsened.  He denies any bowel or urinary incontinence.  He denies any saddle anesthesia.  No fevers.  No history of injection drug use.  Pain is localized to the left low back.  No recent trauma.  Pain is a constant intense ache.  Intermittent sharp and shooting pains down the posterior of the left leg.   Back Pain      Home Medications Prior to Admission medications   Medication Sig Start Date End Date Taking? Authorizing Provider  emtricitabine-tenofovir (TRUVADA) 200-300 MG tablet Take 1 tablet by mouth daily. 01/17/21  Yes [provider]  escitalopram (LEXAPRO) 20 MG tablet Take 1 tablet by mouth daily. 12/05/22  Yes [provider]  oxyCODONE-acetaminophen (PERCOCET/ROXICET) 5-325 MG tablet Take 1 tablet by mouth every 6 (six) hours as needed for severe pain (pain score 7-10). 12/30/23  Yes Ariannie Penaloza, Edsel Petrin, PA-C  predniSONE (STERAPRED UNI-PAK 21 TAB) 10 MG (21) TBPK tablet Take by mouth daily. Take 6 tabs by mouth daily  for 2 days, then 5 tabs for 2 days, then 4 tabs for 2 days, then 3 tabs for  2 days, 2 tabs for 2 days, then 1 tab by mouth daily for 2 days 12/30/23  Yes Gelisa Tieken, Edsel Petrin, PA-C  valACYclovir (VALTREX) 500 MG tablet Take 1 tablet by mouth daily. 01/09/21  Yes [provider]  aspirin-acetaminophen-caffeine (EXCEDRIN MIGRAINE) [phone] MG per tablet Take 2 tablets by mouth every 6 (six) hours as needed for headache.     [provider]  Bismuth Subsalicylate (PEPTO-BISMOL) 262 MG TABS Take 524 mg by mouth every 6 (six) hours as needed (for GI upset).    [provider]  calcium carbonate (TUMS - DOSED IN MG ELEMENTAL CALCIUM) 500 MG chewable tablet Chew 2 tablets by mouth 4 (four) times daily as needed for indigestion or heartburn.     [provider]  dicyclomine (BENTYL) 20 MG tablet Take 1 tablet (20 mg total) by mouth 2 (two) times daily as needed for spasms. 03/28/17   Ward, Chase Picket, PA-C  diphenhydrAMINE (BENADRYL) 25 mg capsule Take 25 mg by mouth at bedtime as needed for allergies or sleep.     [provider]  famotidine (PEPCID) 40 MG tablet Take 40 mg by mouth every evening.    [provider]  magnesium citrate SOLN Take 0.5 Bottles by mouth once.    [provider]  promethazine (PHENERGAN) 25 MG  tablet Take 1 tablet (25 mg total) by mouth every 6 (six) hours as needed for nausea or vomiting. 03/28/17   Ward, Chase Picket, PA-C  Pseudoeph-Doxylamine-DM-APAP (NYQUIL MULTI-SYMPTOM PO) Take 15 mLs by mouth at bedtime as needed (for cough/congestion).    [provider]  simethicone (MYLICON) 125 MG chewable tablet Chew 250 mg by mouth every 6 (six) hours as needed for flatulence.    [provider]  traMADol (ULTRAM) 50 MG tablet Take 1 tablet (50 mg total) by mouth every 6 (six) hours as needed. 03/28/17   Ward, Chase Picket, PA-C  WEGOVY 0.25 MG/0.5ML SOAJ Inject 0.25 mg into the skin once a week.    [provider]      Allergies    Sulfa antibiotics    Review of  Systems   Review of Systems  Musculoskeletal:  Positive for back pain.  All other systems reviewed and are negative.   Physical Exam Updated Vital Signs BP 116/74   Pulse 66   Temp 98.7 F (37.1 C)   Resp 15   SpO2 96%  Physical Exam Vitals and nursing note reviewed.  Constitutional:      General: He is not in acute distress.    Appearance: Normal appearance. He is normal weight. He is not ill-appearing.     Comments: Resting in bed, appears uncomfortable  HENT:     Head: Normocephalic and atraumatic.  Pulmonary:     Effort: Pulmonary effort is normal. No respiratory distress.  Abdominal:     General: Abdomen is flat.  Musculoskeletal:     Cervical back: Neck supple.     Comments: Straight leg raise positive bilaterally, worse on the left.  Midline L-spine TTP without obvious deformity.  No bruising.  He is able to flex and extend bilateral hips and knees.  Skin:    General: Skin is warm and dry.  Neurological:     Mental Status: He is alert and oriented to person, place, and time.  Psychiatric:        Mood and Affect: Mood normal.        Behavior: Behavior normal.     ED Results / Procedures / Treatments   Labs (all labs ordered are listed, but only abnormal results are displayed) Labs Reviewed - No data to display  EKG None  Radiology No results found.  Procedures Procedures    Medications Ordered in ED Medications  lidocaine (LIDODERM) 5 % 1 patch (1 patch Transdermal Patch Applied 12/30/23 0948)  fentaNYL (SUBLIMAZE) injection 50 mcg (50 mcg Intramuscular Given 12/30/23 0948)  ondansetron (ZOFRAN-ODT) disintegrating tablet 4 mg (4 mg Oral Given 12/30/23 0947)  oxyCODONE-acetaminophen (PERCOCET/ROXICET) 5-325 MG per tablet 1 tablet (1 tablet Oral Given 12/30/23 1202)    ED Course/ Medical Decision Making/ A&P                                 Medical Decision Making Risk Prescription drug management.  This patient presents to the ED for concern of  low back pain, this involves an extensive number of treatment options, and is a complaint that carries with it a high risk of complications and morbidity.  Emergent considerations in the differential diagnosis of back pain include:occult fracture, congenital anomalies, tumors, vascular catastrophes, osteomyelitis of vertebrae, infections of disc, meninges or cord, space occupying lesions within canal leading to cord or root compression including epidural abscess.   My initial workup  includes symptom control  Additional history obtained from: Nursing notes from this visit. ED visit for same on 01/01/2023  Afebrile, hemodynamically stable.  38 year old male presenting to the ED for evaluation of low back pain.  Mostly on the left side.  Has a Some history of this.  Has had an MRI in the past and was told that he has a herniated disc.  His back was more sore than normal last night.  When he woke up this morning, back pain was more significant than normal.  He had not taken anything prior to arrival.  He denies red flag symptoms to suggest cord compression syndrome.  No risk factors for epidural abscess.  Pain does also radiate down the posterior of the left leg.  He was treated with pain medication in the emergency department and reported some improvement.  He was able to ambulate.  He was able to urinate with full control.  Short course of pain medication and steroid taper was sent.  He will call his spine specialist to schedule an appointment.  He was given return precautions.  Stable at discharge.  At this time there does not appear to be any evidence of an acute emergency medical condition and the patient appears stable for discharge with appropriate outpatient follow up. Diagnosis was discussed with patient who verbalizes understanding of care plan and is agreeable to discharge. I have discussed return precautions with patient and significant other who verbalizes understanding. Patient encouraged to  follow-up with their spine specialist. All questions answered.  Note: Portions of this report may have been transcribed using voice recognition software. Every effort was made to ensure accuracy; however, inadvertent computerized transcription errors may still be present.         Final Clinical Impression(s) / ED Diagnoses Final diagnoses:  Chronic left-sided low back pain with left-sided sciatica    Rx / DC Orders ED Discharge Orders          Ordered    predniSONE (STERAPRED UNI-PAK 21 TAB) 10 MG (21) TBPK tablet  Daily        12/30/23 1346    oxyCODONE-acetaminophen (PERCOCET/ROXICET) 5-325 MG tablet  Every 6 hours PRN        12/30/23 1346              Mora Bellman 12/30/23 1354    Rondel Baton, MD 01/01/24 1220

## 2023-12-30 NOTE — Discharge Instructions (Addendum)
 You have been seen today for your complaint of low back pain. Your discharge medications include Percocet. This is an opioid pain medication. You should only take this medication as needed for severe pain. You should not drive, operate heavy machinery or make important decisions while taking this medication. You should use alternative methods for pain relief while taking this medication including stretching, gentle range of motion, and alternating tylenol and ibuprofen. Prednisone. This is a steroid. Take this as prescribed and for the entire duration of the prescription. Follow up with: Your spine specialist Please seek immediate medical care if you develop any of the following symptoms: You are not able to control when you pee or poop. You have severe back pain and: Nausea or vomiting. Pain in your chest or abdomen. Shortness of breath. You faint. At this time there does not appear to be the presence of an emergent medical condition, however there is always the potential for conditions to change. Please read and follow the below instructions.  Do not take your medicine if  develop an itchy rash, swelling in your mouth or lips, or difficulty breathing; call 911 and seek immediate emergency medical attention if this occurs.  You may review your lab tests and imaging results in their entirety on your MyChart account.  Please discuss all results of fully with your primary care provider and other specialist at your follow-up visit.  Note: Portions of this text may have been transcribed using voice recognition software. Every effort was made to ensure accuracy; however, inadvertent computerized transcription errors may still be present.

## 2023-12-30 NOTE — ED Triage Notes (Signed)
 Pt bib [facility] with c/o lower back pain, hx of herniated disc. Tightness started last night. Endorses nausea and felt lightheaded from the pain
